# Patient Record
Sex: Female | Born: 1975 | Race: White | Hispanic: No | Marital: Married | Smoking: Never smoker
Health system: Southern US, Community
[De-identification: ages and names within clinical notes are randomized; demographics above are authoritative.]

## PROBLEM LIST (undated history)

## (undated) DIAGNOSIS — L309 Dermatitis, unspecified: Secondary | ICD-10-CM

## (undated) DIAGNOSIS — D649 Anemia, unspecified: Secondary | ICD-10-CM

---

## 2001-05-18 ENCOUNTER — Other Ambulatory Visit: Admission: RE | Admit: 2001-05-18 | Discharge: 2001-05-18 | Payer: Self-pay | Admitting: Obstetrics and Gynecology

## 2001-06-01 ENCOUNTER — Encounter: Payer: Self-pay | Admitting: Obstetrics and Gynecology

## 2001-06-01 ENCOUNTER — Ambulatory Visit (HOSPITAL_COMMUNITY): Admission: RE | Admit: 2001-06-01 | Discharge: 2001-06-01 | Payer: Self-pay | Admitting: Obstetrics and Gynecology

## 2001-06-29 ENCOUNTER — Encounter: Payer: Self-pay | Admitting: Obstetrics and Gynecology

## 2001-06-29 ENCOUNTER — Ambulatory Visit (HOSPITAL_COMMUNITY): Admission: RE | Admit: 2001-06-29 | Discharge: 2001-06-29 | Payer: Self-pay | Admitting: Obstetrics and Gynecology

## 2001-08-11 ENCOUNTER — Inpatient Hospital Stay (HOSPITAL_COMMUNITY): Admission: AD | Admit: 2001-08-11 | Discharge: 2001-08-11 | Payer: Self-pay | Admitting: *Deleted

## 2001-10-28 ENCOUNTER — Inpatient Hospital Stay (HOSPITAL_COMMUNITY): Admission: AD | Admit: 2001-10-28 | Discharge: 2001-10-30 | Payer: Self-pay | Admitting: Obstetrics and Gynecology

## 2002-08-02 ENCOUNTER — Other Ambulatory Visit: Admission: RE | Admit: 2002-08-02 | Discharge: 2002-08-02 | Payer: Self-pay | Admitting: Obstetrics and Gynecology

## 2003-03-13 ENCOUNTER — Other Ambulatory Visit: Admission: RE | Admit: 2003-03-13 | Discharge: 2003-03-13 | Payer: Self-pay | Admitting: Obstetrics and Gynecology

## 2003-07-17 ENCOUNTER — Inpatient Hospital Stay (HOSPITAL_COMMUNITY): Admission: AD | Admit: 2003-07-17 | Discharge: 2003-07-19 | Payer: Self-pay | Admitting: Obstetrics and Gynecology

## 2003-08-31 ENCOUNTER — Other Ambulatory Visit: Admission: RE | Admit: 2003-08-31 | Discharge: 2003-08-31 | Payer: Self-pay | Admitting: Obstetrics and Gynecology

## 2012-04-19 ENCOUNTER — Other Ambulatory Visit: Payer: Self-pay | Admitting: Obstetrics and Gynecology

## 2012-05-26 ENCOUNTER — Encounter (HOSPITAL_COMMUNITY): Payer: Self-pay | Admitting: Pharmacist

## 2012-06-01 NOTE — H&P (Signed)
Veronica Murphy, BUCHNER NO.:  [id number]  MEDICAL RECORD NO.:  [id number]  LOCATION:                                 FACILITY:  PHYSICIAN:  Malachi Pro. Ambrose Mantle, M.D. DATE OF BIRTH:  Jun 12, 1975  DATE OF ADMISSION:  06/02/2012 DATE OF DISCHARGE:                             HISTORY & PHYSICAL   PRESENT ILLNESS:  This is a 37 year old white female, para 2-0-0-2, who is admitted to the hospital for D and C conization because of high grade squamous intraepithelial lesion on Pap smear, February 03, 2012 showed a high-grade squamous intraepithelial lesion, moderate dysplasia was present.  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  The patient was advised conization and she is admitted now for that procedure.  PAST MEDICAL HISTORY:  Last period just started 3 days ago.  History is of eczema.  SURGICAL HISTORY:  She has had no surgical procedures.  ALLERGIES:  She is allergic to NEOMYCIN but no LATEX allergy.  SOCIAL HISTORY:  She rarely drinks alcohol.  She has never smoked tobacco.  Denies illicit substance abuse.  PHYSICAL EXAMINATION:  VITAL SIGNS:  On admission, blood pressure is 100/60, pulse is 80. HEAD, EYES, NOSE, AND THROAT:  The patient is a pale appearing, very slender white female, in no distress. NECK:  Supple without thyromegaly.  Pharynx is clear. LUNGS:  Clear to auscultation. HEART:  Normal size and sounds.  No murmurs. ABDOMEN:  Soft, nontender.  No masses are palpable. PELVIC:  Vulva and vagina have some menstrual blood present.  Cervix is clean.  The uterus is mid plane, normal size.  The adnexa are free of masses.  ADMITTING IMPRESSION:  High grade cervical dysplasia.  The patient is admitted for D and C  conization of the cervix.  Risks of surgery have been given to the patient including but not limited to hemorrhage, infection, perforation of the uterus, injury to bladder or bowel, deep venous thrombosis, blood transfusion.  She is prepared to go for surgery.     Malachi Pro. Ambrose Mantle, M.D.    TFH/MEDQ  D:  06/01/2012  T:  06/01/2012  Job:  [id number]

## 2012-06-02 ENCOUNTER — Ambulatory Visit (HOSPITAL_COMMUNITY)
Admission: RE | Admit: 2012-06-02 | Discharge: 2012-06-02 | Disposition: A | Discharge status: Home / Self Care | Payer: 59 | Source: Ambulatory Visit | Attending: Obstetrics and Gynecology | Admitting: Obstetrics and Gynecology

## 2012-06-02 ENCOUNTER — Ambulatory Visit (HOSPITAL_COMMUNITY): Payer: 59 | Admitting: Anesthesiology

## 2012-06-02 ENCOUNTER — Encounter (HOSPITAL_COMMUNITY)
Admission: RE | Disposition: A | Discharge status: Home / Self Care | Payer: Self-pay | Source: Ambulatory Visit | Attending: Obstetrics and Gynecology

## 2012-06-02 ENCOUNTER — Encounter (HOSPITAL_COMMUNITY): Payer: Self-pay | Admitting: Anesthesiology

## 2012-06-02 ENCOUNTER — Encounter (HOSPITAL_COMMUNITY): Payer: Self-pay | Admitting: *Deleted

## 2012-06-02 DIAGNOSIS — N871 Moderate cervical dysplasia: Secondary | ICD-10-CM | POA: Insufficient documentation

## 2012-06-02 DIAGNOSIS — IMO0002 Reserved for concepts with insufficient information to code with codable children: Secondary | ICD-10-CM

## 2012-06-02 HISTORY — PX: CERVICAL CONIZATION W/BX: SHX1330

## 2012-06-02 HISTORY — DX: Dermatitis, unspecified: L30.9

## 2012-06-02 HISTORY — DX: Anemia, unspecified: D64.9

## 2012-06-02 LAB — CBC WITH DIFFERENTIAL/PLATELET
Basophils Relative: 0 % (ref 0–1)
Eosinophils Absolute: 0.1 10*3/uL (ref 0.0–0.7)
MCH: 29.6 pg (ref 26.0–34.0)
MCHC: 33.4 g/dL (ref 30.0–36.0)
Neutro Abs: 2.9 10*3/uL (ref 1.7–7.7)
Neutrophils Relative %: 53 % (ref 43–77)
Platelets: 200 10*3/uL (ref 150–400)
RBC: 4.46 MIL/uL (ref 3.87–5.11)

## 2012-06-02 LAB — COMPREHENSIVE METABOLIC PANEL
ALT: 11 U/L (ref 0–35)
AST: 15 U/L (ref 0–37)
Albumin: 3.7 g/dL (ref 3.5–5.2)
Alkaline Phosphatase: 51 U/L (ref 39–117)
Potassium: 4 mEq/L (ref 3.5–5.1)
Sodium: 138 mEq/L (ref 135–145)
Total Protein: 6.5 g/dL (ref 6.0–8.3)

## 2012-06-02 LAB — URINE MICROSCOPIC-ADD ON

## 2012-06-02 LAB — URINALYSIS, ROUTINE W REFLEX MICROSCOPIC
Bilirubin Urine: NEGATIVE
Glucose, UA: NEGATIVE mg/dL
Nitrite: NEGATIVE
Specific Gravity, Urine: 1.025 (ref 1.005–1.030)
pH: 5.5 (ref 5.0–8.0)

## 2012-06-02 SURGERY — CONE BIOPSY, CERVIX
Anesthesia: General | Site: Vagina | Wound class: Clean Contaminated

## 2012-06-02 MED ORDER — IBUPROFEN 600 MG PO TABS: 600.0000 mg | ORAL_TABLET | Freq: Four times a day (QID) | ORAL | Status: AC | PRN

## 2012-06-02 MED ORDER — VASOPRESSIN 20 UNIT/ML IJ SOLN
INTRAMUSCULAR | Status: AC
  Filled 2012-06-02: qty 1

## 2012-06-02 MED ORDER — LIDOCAINE HCL (CARDIAC) 20 MG/ML IV SOLN
INTRAVENOUS | Status: DC | PRN
  Administered 2012-06-02: 20 mg via INTRAVENOUS
  Administered 2012-06-02: 30 mg via INTRAVENOUS

## 2012-06-02 MED ORDER — PROPOFOL 10 MG/ML IV EMUL
INTRAVENOUS | Status: DC | PRN
  Administered 2012-06-02: 130 mg via INTRAVENOUS

## 2012-06-02 MED ORDER — PROPOFOL 10 MG/ML IV EMUL
INTRAVENOUS | Status: AC
  Filled 2012-06-02: qty 20

## 2012-06-02 MED ORDER — SODIUM CHLORIDE 0.9 % IJ SOLN
INTRAMUSCULAR | Status: DC | PRN
  Administered 2012-06-02: 120 mL

## 2012-06-02 MED ORDER — MIDAZOLAM HCL 5 MG/5ML IJ SOLN
INTRAMUSCULAR | Status: DC | PRN
  Administered 2012-06-02: 2 mg via INTRAVENOUS

## 2012-06-02 MED ORDER — FLUMAZENIL 0.5 MG/5ML IV SOLN
INTRAVENOUS | Status: DC | PRN
  Administered 2012-06-02: 0.2 mg via INTRAVENOUS

## 2012-06-02 MED ORDER — VASOPRESSIN 20 UNIT/ML IJ SOLN
INTRAMUSCULAR | Status: DC | PRN
  Administered 2012-06-02: 10 [IU]

## 2012-06-02 MED ORDER — FENTANYL CITRATE 0.05 MG/ML IJ SOLN
INTRAMUSCULAR | Status: AC
  Filled 2012-06-02: qty 5

## 2012-06-02 MED ORDER — KETOROLAC TROMETHAMINE 30 MG/ML IJ SOLN
INTRAMUSCULAR | Status: DC | PRN
  Administered 2012-06-02: 30 mg via INTRAVENOUS

## 2012-06-02 MED ORDER — FENTANYL CITRATE 0.05 MG/ML IJ SOLN
INTRAMUSCULAR | Status: DC | PRN
  Administered 2012-06-02 (×2): 50 ug via INTRAVENOUS

## 2012-06-02 MED ORDER — KETOROLAC TROMETHAMINE 30 MG/ML IJ SOLN
INTRAMUSCULAR | Status: AC
  Filled 2012-06-02: qty 1

## 2012-06-02 MED ORDER — FENTANYL CITRATE 0.05 MG/ML IJ SOLN: 25.0000 ug | INTRAMUSCULAR | Status: DC | PRN

## 2012-06-02 MED ORDER — ACETIC ACID 4% SOLUTION
Status: DC | PRN
  Administered 2012-06-02: 1 via TOPICAL

## 2012-06-02 MED ORDER — EPHEDRINE SULFATE 50 MG/ML IJ SOLN
INTRAMUSCULAR | Status: DC | PRN
  Administered 2012-06-02: 10 mg via INTRAVENOUS

## 2012-06-02 MED ORDER — KETOROLAC TROMETHAMINE 30 MG/ML IJ SOLN: 15.0000 mg | Freq: Once | INTRAMUSCULAR | Status: DC | PRN

## 2012-06-02 MED ORDER — MIDAZOLAM HCL 2 MG/2ML IJ SOLN
INTRAMUSCULAR | Status: AC
  Filled 2012-06-02: qty 2

## 2012-06-02 MED ORDER — IODINE STRONG (LUGOLS) 5 % PO SOLN
ORAL | Status: DC | PRN
  Administered 2012-06-02: 0.2 mL via ORAL

## 2012-06-02 MED ORDER — LIDOCAINE HCL (CARDIAC) 20 MG/ML IV SOLN
INTRAVENOUS | Status: AC
  Filled 2012-06-02: qty 5

## 2012-06-02 MED ORDER — EPHEDRINE 5 MG/ML INJ
INTRAVENOUS | Status: AC
  Filled 2012-06-02: qty 10

## 2012-06-02 MED ORDER — LACTATED RINGERS IV SOLN
INTRAVENOUS | Status: DC
  Administered 2012-06-02 (×2): 125 mL/h via INTRAVENOUS

## 2012-06-02 MED ORDER — ONDANSETRON HCL 4 MG/2ML IJ SOLN
INTRAMUSCULAR | Status: AC
  Filled 2012-06-02: qty 2

## 2012-06-02 MED ORDER — FLUMAZENIL 1 MG/10ML IV SOLN
INTRAVENOUS | Status: AC
  Filled 2012-06-02: qty 10

## 2012-06-02 MED ORDER — ONDANSETRON HCL 4 MG/2ML IJ SOLN
INTRAMUSCULAR | Status: DC | PRN
  Administered 2012-06-02: 4 mg via INTRAVENOUS

## 2012-06-02 SURGICAL SUPPLY — 24 items
APPLICATOR COTTON TIP 6IN STRL (MISCELLANEOUS) ×2 IMPLANT
BLADE SURG 15 STRL LF C SS BP (BLADE) ×1 IMPLANT
BLADE SURG 15 STRL SS (BLADE) ×1
CLOTH BEACON ORANGE TIMEOUT ST (SAFETY) ×2 IMPLANT
CONTAINER PREFILL 10% NBF 60ML (FORM) ×6 IMPLANT
COUNTER NEEDLE 1200 MAGNETIC (NEEDLE) ×2 IMPLANT
DRESSING TELFA 8X3 (GAUZE/BANDAGES/DRESSINGS) ×2 IMPLANT
ELECT REM PT RETURN 9FT ADLT (ELECTROSURGICAL) ×2
ELECTRODE REM PT RTRN 9FT ADLT (ELECTROSURGICAL) ×1 IMPLANT
GAUZE SPONGE 4X4 16PLY XRAY LF (GAUZE/BANDAGES/DRESSINGS) ×2 IMPLANT
GLOVE BIO SURGEON STRL SZ7.5 (GLOVE) ×4 IMPLANT
GOWN STRL REIN XL XLG (GOWN DISPOSABLE) ×4 IMPLANT
NEEDLE SPNL 22GX3.5 QUINCKE BK (NEEDLE) ×2 IMPLANT
NS IRRIG 1000ML POUR BTL (IV SOLUTION) ×2 IMPLANT
PACK VAGINAL MINOR WOMEN LF (CUSTOM PROCEDURE TRAY) ×2 IMPLANT
PAD OB MATERNITY 4.3X12.25 (PERSONAL CARE ITEMS) ×2 IMPLANT
PENCIL BUTTON HOLSTER BLD 10FT (ELECTRODE) ×2 IMPLANT
SCOPETTES 8  STERILE (MISCELLANEOUS) ×2
SCOPETTES 8 STERILE (MISCELLANEOUS) ×2 IMPLANT
SPONGE SURGIFOAM ABS GEL 12-7 (HEMOSTASIS) IMPLANT
SUT CHROMIC 0 CT 1 (SUTURE) ×4 IMPLANT
SYR CONTROL 10ML LL (SYRINGE) ×2 IMPLANT
TOWEL OR 17X24 6PK STRL BLUE (TOWEL DISPOSABLE) ×4 IMPLANT
WATER STERILE IRR 1000ML POUR (IV SOLUTION) ×2 IMPLANT

## 2012-06-02 NOTE — Progress Notes (Signed)
Patient ID: Veronica Murphy, female   DOB: Dec 30, 1975, 37 y.o.   MRN: [id number] I examined this lady 06-01-12 and she reports no change in her health since that time.

## 2012-06-02 NOTE — Anesthesia Postprocedure Evaluation (Signed)
Anesthesia Post Note  Patient: Veronica Murphy  Procedure(s) Performed: Procedure(s) (LRB): CONIZATION CERVIX WITH BIOPSY (N/A)  Anesthesia type: General  Patient location: PACU  Post pain: Pain level controlled  Post assessment: Post-op Vital signs reviewed  Last Vitals:  Filed Vitals:   06/02/12 0830  BP: 107/69  Pulse: 70  Temp:   Resp: 12    Post vital signs: Reviewed  Level of consciousness: sedated  Complications: No apparent anesthesia complications

## 2012-06-02 NOTE — Anesthesia Preprocedure Evaluation (Signed)
Anesthesia Evaluation  Patient identified by MRN, date of birth, ID band Patient awake    Reviewed: Allergy & Precautions, H&P , NPO status , Patient's Chart, lab work & pertinent test results, reviewed documented beta blocker date and time   History of Anesthesia Complications Negative for: history of anesthetic complications  Airway Mallampati: II TM Distance: >3 FB Neck ROM: full    Dental  (+) Teeth Intact   Pulmonary neg pulmonary ROS,  breath sounds clear to auscultation  Pulmonary exam normal       Cardiovascular Exercise Tolerance: Good negative cardio ROS  Rhythm:regular Rate:Normal     Neuro/Psych negative neurological ROS  negative psych ROS   GI/Hepatic negative GI ROS, Neg liver ROS,   Endo/Other  negative endocrine ROS  Renal/GU negative Renal ROS  Female GU complaint     Musculoskeletal   Abdominal   Peds  Hematology negative hematology ROS (+)   Anesthesia Other Findings   Reproductive/Obstetrics negative OB ROS                           Anesthesia Physical Anesthesia Plan  ASA: I  Anesthesia Plan: General LMA   Post-op Pain Management:    Induction:   Airway Management Planned:   Additional Equipment:   Intra-op Plan:   Post-operative Plan:   Informed Consent: I have reviewed the patients History and Physical, chart, labs and discussed the procedure including the risks, benefits and alternatives for the proposed anesthesia with the patient or authorized representative who has indicated his/her understanding and acceptance.   Dental Advisory Given  Plan Discussed with: CRNA and Surgeon  Anesthesia Plan Comments:         Anesthesia Quick Evaluation  

## 2012-06-02 NOTE — Op Note (Signed)
Operative note on Veronica Murphy:  Date of the operation: 06/02/2012  Preoperative diagnosis high-grade squamous intraepithelial lesion  Postop diagnosis: Same  Operation: D&C conization of the cervix  Operator: Charle Mclaurin  Anesthesia Gen.  The patient was brought to the operating room and placed under satisfactory general anesthesia and placed in lithotomy position in the [place] stirrups. A time out was done. The vulva was prepped with Betadine solution and the urethra was prepped and the bladder was emptied with a [place] catheter. Exam revealed the uterus to be anterior upper limit of normal size, the adnexa were free of masses. A speculum was placed into the vagina and the cervix was prepped with 5% acetic acid. The abnormality was mainly on the posterior lip of the cervix extending into the endocervical canal at 5:00. The anterior lips seem to be spared. The cervix was injected with 8 cc of a solution prepared by mixing 10 units of Pitressin and 120 cc of saline. A conization of the cervix was then done trying to get beyond the abnormality on the posteriorly lip. The cervix was then prepped with Betadine and an endocervical curettage was done producing minimal tissue. The cervical canal was dilated so that it would admit a small curet and an endometrial curettage was done. All tissue was preserved. Hemostasis was achieved on the cervix with the Bovie. 4 sutures of 0 chromic were used to reapproximate the cervix by placing the sutures from the 11-1,5to7,2to4 and 8 to 10:00.the uterine canal was sounded confirming patency of the canal and the procedure was terminated. Blood loss about 10 cc. Sponge and needle counts were correct. The patient was returned to recovery in satisfactory condition

## 2012-06-02 NOTE — Transfer of Care (Signed)
Immediate Anesthesia Transfer of Care Note  Patient: Veronica Murphy  Procedure(s) Performed: Procedure(s) (LRB) with comments: CONIZATION CERVIX WITH BIOPSY (N/A)  Patient Location: PACU  Anesthesia Type:General  Level of Consciousness: sedated and patient cooperative  Airway & Oxygen Therapy: Patient Spontanous Breathing and Patient connected to nasal cannula oxygen  Post-op Assessment: Report given to PACU RN and Post -op Vital signs reviewed and stable  Post vital signs: Reviewed and stable  Complications: No apparent anesthesia complications

## 2012-06-03 ENCOUNTER — Encounter (HOSPITAL_COMMUNITY): Payer: Self-pay | Admitting: Obstetrics and Gynecology

## 2014-07-24 ENCOUNTER — Other Ambulatory Visit: Payer: Self-pay | Admitting: Obstetrics and Gynecology

## 2014-07-24 DIAGNOSIS — N644 Mastodynia: Secondary | ICD-10-CM

## 2014-07-27 ENCOUNTER — Ambulatory Visit
Admission: RE | Admit: 2014-07-27 | Discharge: 2014-07-27 | Disposition: A | Discharge status: Home / Self Care | Payer: 59 | Source: Ambulatory Visit | Attending: Obstetrics and Gynecology | Admitting: Obstetrics and Gynecology

## 2014-07-27 ENCOUNTER — Other Ambulatory Visit: Payer: Self-pay | Admitting: Obstetrics and Gynecology

## 2014-07-27 DIAGNOSIS — N644 Mastodynia: Secondary | ICD-10-CM

## 2015-11-04 IMAGING — MG MM DIAG BREAST TOMO BILATERAL
8 series · 9 of 24 positions shown · non-contrast
Comparison: None

CLINICAL DATA: Patient with diffuse bilateral breast pain.
Additionally the patient reports focal tenderness within the 6
o'clock position right breast.

EXAM:
DIGITAL DIAGNOSTIC RIGHT MAMMOGRAM WITH 3D TOMOSYNTHESIS WITH CAD
ULTRASOUND RIGHT BREAST

[R MLO]
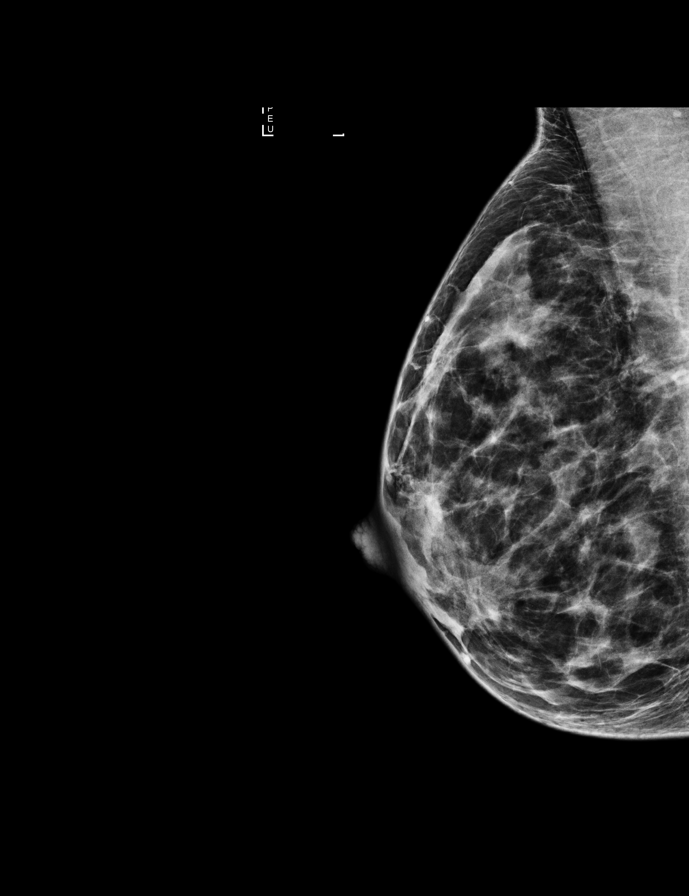

[L MLO]
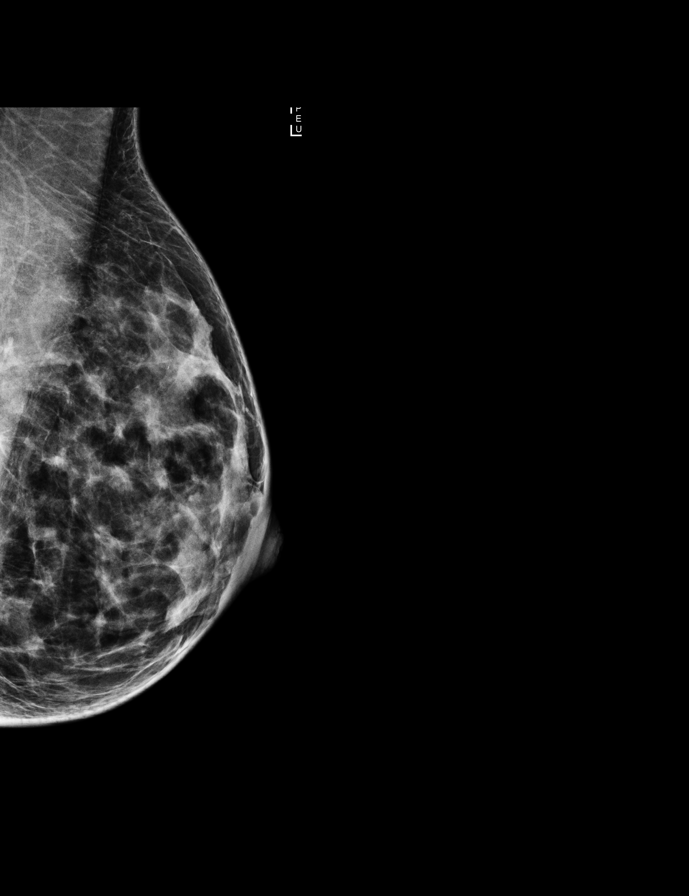

[R CC]
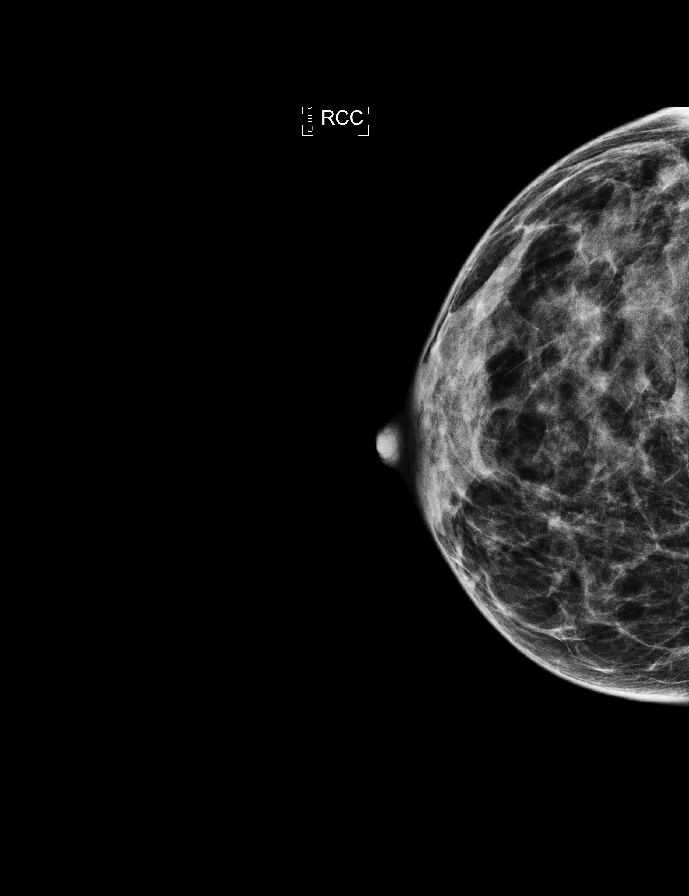

[L CC]
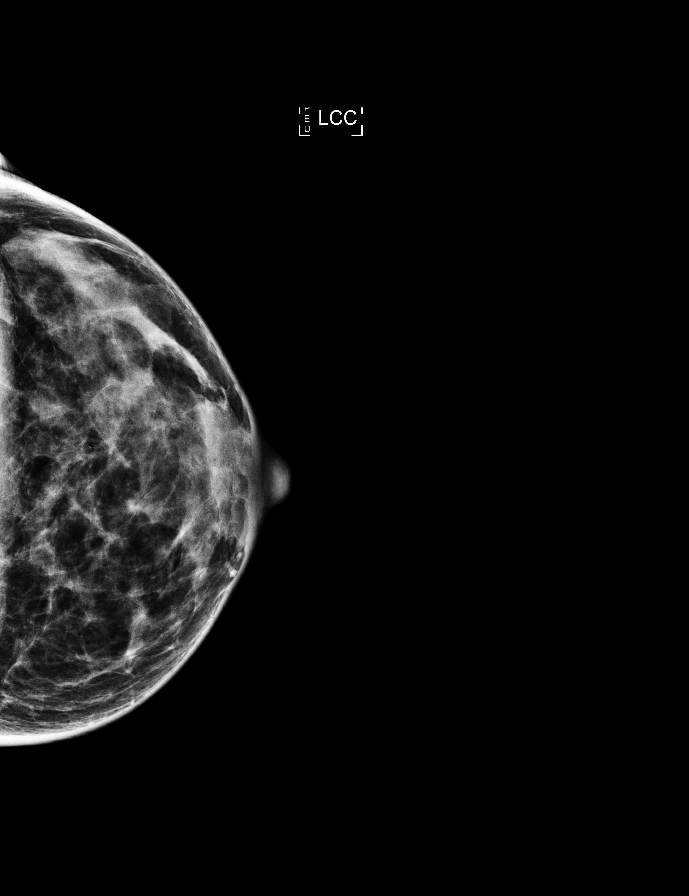

[R MLO tomo · 2 of 44 frames shown]
[frame 15/44]
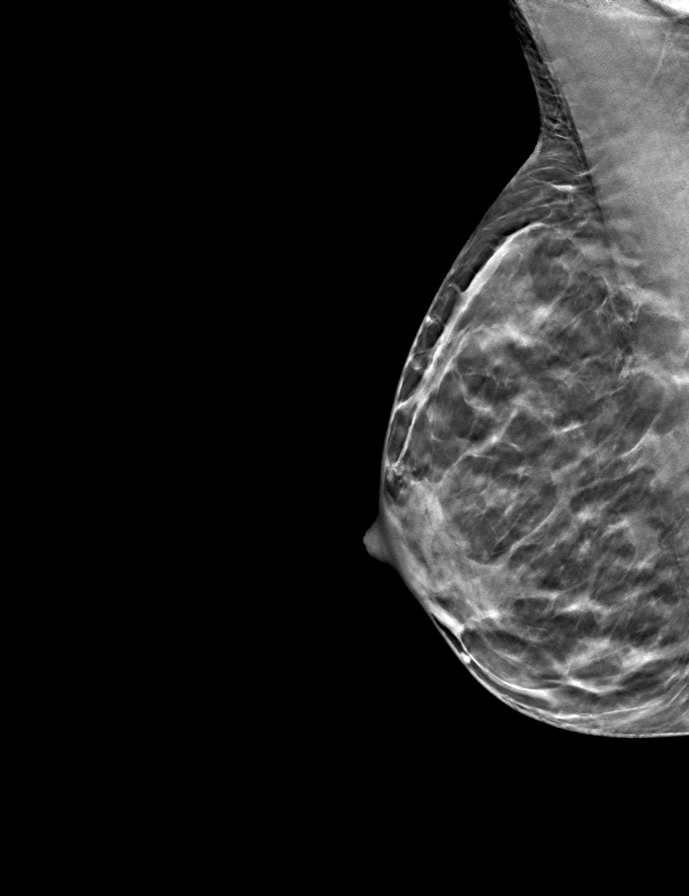
[frame 23/44]
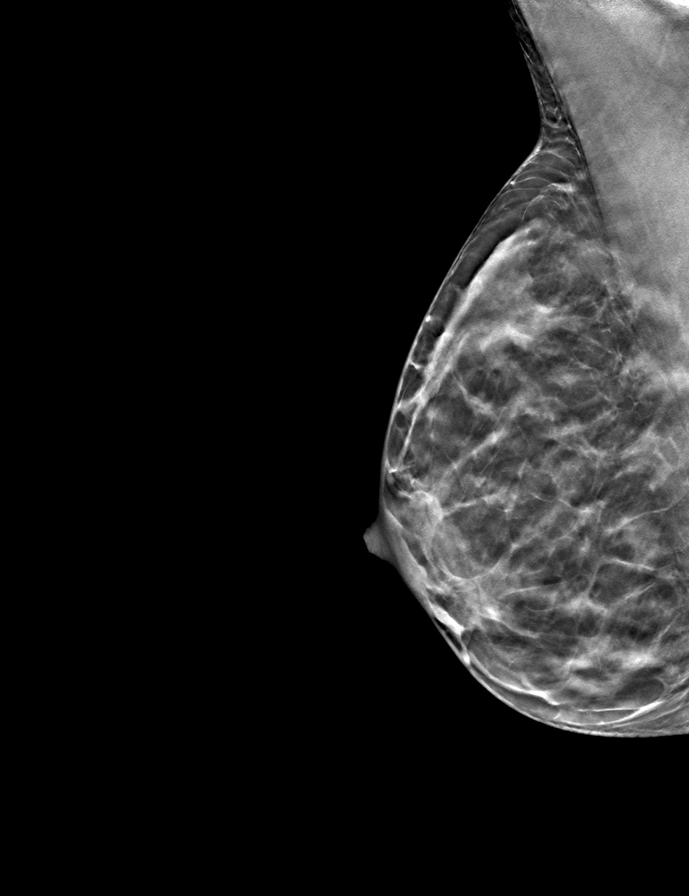

[R CC tomo · tomo slice 22/43.0]
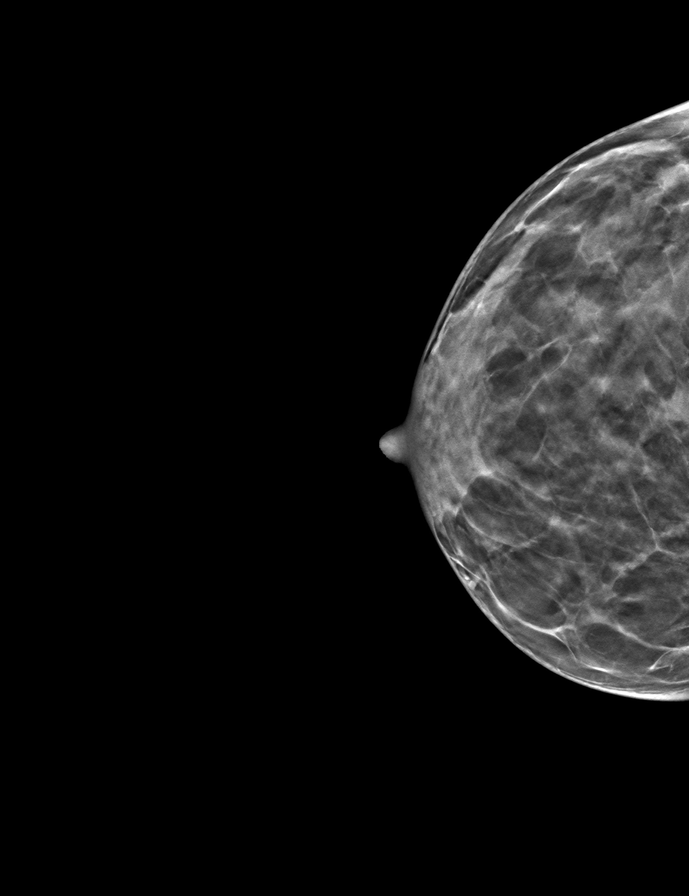

[L MLO tomo · tomo slice 23/44.0]
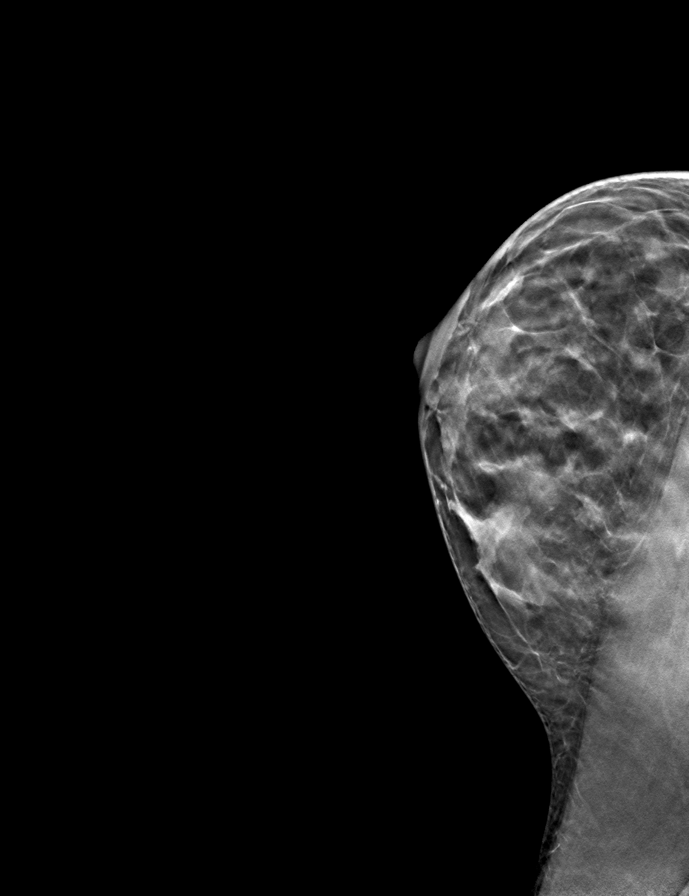

[L CC tomo · tomo slice 22/43.0]
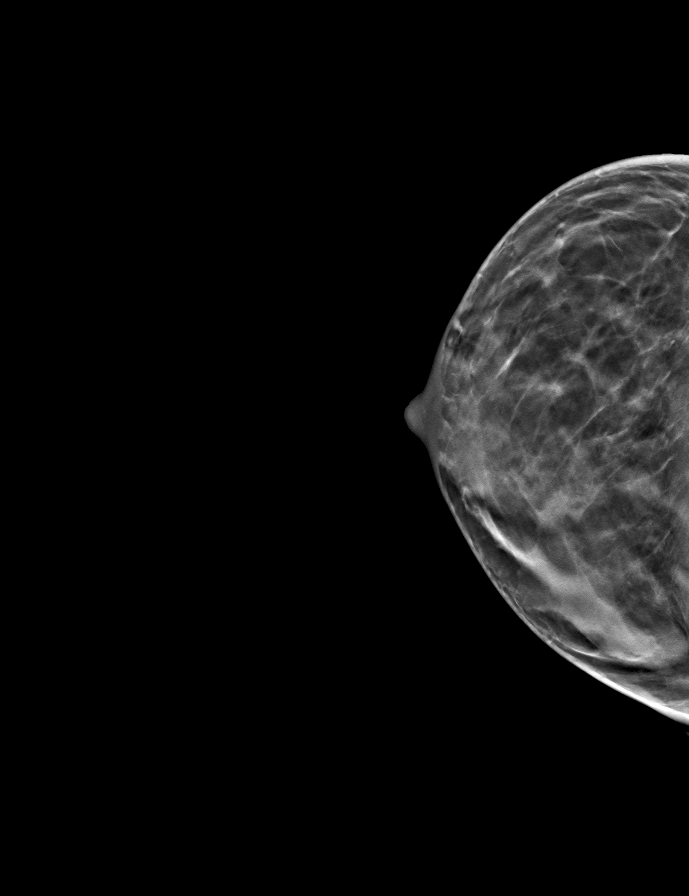

[9 of 24 positions shown; findings below may reference images not displayed]

ACR Breast Density Category c: The breast tissue is heterogeneously
dense, which may obscure small masses.
FINDINGS: No concerning masses, calcifications or architectural distortion
identified within either breast.

Mammographic images were processed with CAD.

On physical exam, I palpate no discrete mass within the 6 o'clock
position right breast.

Targeted ultrasound is performed, showing no suspicious abnormality
within the 6 o'clock position right breast at the reported site of
focal tenderness.
IMPRESSION: No mammographic evidence for malignancy.

RECOMMENDATION:
Screening mammogram at age 41 unless there are persistent or
intervening clinical concerns. (Code:CN-5-BDM)

I have discussed the findings and recommendations with the patient.
Results were also provided in writing at the conclusion of the
visit. If applicable, a reminder letter will be sent to the patient
regarding the next appointment.

BI-RADS CATEGORY  1: Negative.

## 2021-01-05 ENCOUNTER — Other Ambulatory Visit: Payer: Self-pay

## 2021-01-05 ENCOUNTER — Emergency Department (HOSPITAL_BASED_OUTPATIENT_CLINIC_OR_DEPARTMENT_OTHER): Payer: 59

## 2021-01-05 ENCOUNTER — Emergency Department (HOSPITAL_BASED_OUTPATIENT_CLINIC_OR_DEPARTMENT_OTHER)
Admission: EM | Admit: 2021-01-05 | Discharge: 2021-01-05 | Disposition: A | Discharge status: Home / Self Care | Payer: 59 | Attending: Emergency Medicine | Admitting: Emergency Medicine

## 2021-01-05 ENCOUNTER — Encounter (HOSPITAL_BASED_OUTPATIENT_CLINIC_OR_DEPARTMENT_OTHER): Payer: Self-pay

## 2021-01-05 DIAGNOSIS — M25571 Pain in right ankle and joints of right foot: Secondary | ICD-10-CM | POA: Insufficient documentation

## 2021-01-05 DIAGNOSIS — Y9368 Activity, volleyball (beach) (court): Secondary | ICD-10-CM | POA: Insufficient documentation

## 2021-01-05 DIAGNOSIS — X501XXA Overexertion from prolonged static or awkward postures, initial encounter: Secondary | ICD-10-CM | POA: Diagnosis not present

## 2021-01-05 MED ORDER — IBUPROFEN 800 MG PO TABS
800.0000 mg | ORAL_TABLET | Freq: Once | ORAL | Status: AC
  Administered 2021-01-05: 800 mg via ORAL
  Filled 2021-01-05: qty 1

## 2021-01-05 NOTE — ED Triage Notes (Signed)
Pt arrives with c/o pain to right ankle states that she rolled it while playing volleyball today and heard "a crunching sound". No medication PTA.

## 2021-01-05 NOTE — ED Notes (Signed)
Pt refused our crutches, she states she has a pair at home she plans to use.

## 2021-01-05 NOTE — ED Provider Notes (Signed)
[facility] EMERGENCY DEPARTMENT Provider Note   CSN: [id number] Arrival date & time: 01/05/21  1546     History Chief Complaint  Patient presents with   Ankle Pain    Veronica Murphy is a 45 y/o F presenting to the ED for right ankle pain and swelling after landing on it at volleyball 1 hour pta. The patient states that she went to step toward the ball and stepped on her husband's shoe causing her ankle to invert and landing her body weight on the lateral side of her ankle. She was able to bear weight with pain. Denies any numbness or tingling. She denies any medical or surgical history. Denies any daily medications. Allergic to Neomycin. LMP 7 days ago.    Ankle Pain     Past Medical History:  Diagnosis Date   Anemia    hx   Eczema    SVD (spontaneous vaginal delivery)    x 3    There are no problems to display for this patient.   Past Surgical History:  Procedure Laterality Date   CERVICAL CONIZATION W/BX  06/02/2012   Procedure: CONIZATION CERVIX WITH BIOPSY;  Surgeon: Bing Plume, MD;  Location: [facility];  Service: Gynecology;  Laterality: N/A;     OB History   No obstetric history on file.     No family history on file.  Social History   Tobacco Use   Smoking status: Never   Smokeless tobacco: Never  Vaping Use   Vaping Use: Never used  Substance Use Topics   Alcohol use: Yes    Comment: socially   Drug use: No    Home Medications Prior to Admission medications   Medication Sig Start Date End Date Taking? Authorizing Provider  Desoximetasone (TOPICORT) 0.25 % ointment Apply 1 application topically 2 (two) times a week.    [provider]  ibuprofen (ADVIL,MOTRIN) 600 MG tablet Take 1 tablet (600 mg total) by mouth every 6 (six) hours as needed for pain. 06/02/12   Tracey Harries, MD    Allergies    Neomycin  Review of Systems   Review of Systems  Musculoskeletal:  Positive for arthralgias and joint swelling.  Skin:   Negative for color change and rash.  Neurological:  Negative for syncope and weakness.  All other systems reviewed and are negative.  Physical Exam Updated Vital Signs BP (!) 130/98 (BP Location: Right Arm)   Pulse 82   Temp 98.3 F (36.8 C) (Oral)   Resp 18   Ht 5\' 4"  (1.626 m)   Wt 56.7 kg   LMP 12/27/2020   SpO2 100%   BMI 21.46 kg/m   Physical Exam Vitals and nursing note reviewed.  Constitutional:      Appearance: Normal appearance.  Eyes:     General: No scleral icterus. Pulmonary:     Effort: Pulmonary effort is normal. No respiratory distress.  Musculoskeletal:        General: Swelling, tenderness and signs of injury present. No deformity.     Comments: R ankle - No overlying skin color changes or obvious deformities. Moderate lateral swelling over the malleolus. Limited ROM secondary to pain. Neurovascularly intact.   Skin:    General: Skin is dry.     Findings: No bruising or rash.  Neurological:     General: No focal deficit present.     Mental Status: She is alert. Mental status is at baseline.  Psychiatric:  Mood and Affect: Mood normal.    ED Results / Procedures / Treatments   Labs (all labs ordered are listed, but only abnormal results are displayed) Labs Reviewed - No data to display  EKG None  Radiology DG Ankle Complete Right  Result Date: 01/05/2021 CLINICAL DATA:  Ankle injury today. Patient heard a crunching sound and complains of pain. EXAM: RIGHT ANKLE - COMPLETE 3+ VIEW COMPARISON:  None. FINDINGS: The mineralization and alignment are normal. There is no evidence of acute fracture or dislocation. The joint spaces are preserved. There is moderate lateral soft tissue swelling without evidence of foreign body or soft tissue emphysema. IMPRESSION: No evidence of acute osseous injury. Moderate lateral soft tissue swelling. Electronically Signed   By: Carey Bullocks M.D.   On: 01/05/2021 16:42    Procedures Procedures   Medications  Ordered in ED Medications  ibuprofen (ADVIL) tablet 800 mg (800 mg Oral Given 01/05/21 1629)    ED Course  I have reviewed the triage vital signs and the nursing notes.  Pertinent labs & imaging results that were available during my care of the patient were reviewed by me and considered in my medical decision making (see chart for details).  Veronica Murphy is a 45 y/o F presenting for right ankle pain and swelling after inverting it during a volleyball game 1hr pta. Differential includes fracture, ankle sprain, ankle strain.  Patient declined stronger pain medication and requested ibuprofen.   I personally review the patients ankle xray and no bony abnormalities or fractures noticed, just lateral soft tissue swelling.   Re-evaluated patient and she reports with elevation and medication, her pain is decreasing.   Discussed the RICE method with the patient and husband along with using Ibuprofen to help with swelling and with pain. Will refer to non-surgical orthopedics. Patient and husband are agreeable with plan and have no further questions. Return precautions given. Patient is stable and is being discharged in good condition.    MDM Rules/Calculators/A&P                          Final Clinical Impression(s) / ED Diagnoses Final diagnoses:  Acute right ankle pain    Rx / DC Orders ED Discharge Orders     None        Achille Rich, PA-C 01/05/21 1712    Pollyann Savoy, MD 01/05/21 [id number]

## 2021-01-05 NOTE — ED Notes (Signed)
Patient transported to X-ray 

## 2021-01-05 NOTE — Discharge Instructions (Addendum)
Review the RICE method handout for treatment or ankle swelling.  Follow up with Non-surgical Ortho attached.  Ibuprofen 800mg  every 8 hours can help with pain.  Please return for any new or worsening symptoms.

## 2021-12-30 ENCOUNTER — Other Ambulatory Visit: Payer: Self-pay | Admitting: Family Medicine

## 2021-12-30 DIAGNOSIS — K529 Noninfective gastroenteritis and colitis, unspecified: Secondary | ICD-10-CM

## 2022-01-16 ENCOUNTER — Ambulatory Visit
Admission: RE | Admit: 2022-01-16 | Discharge: 2022-01-16 | Disposition: A | Discharge status: Home / Self Care | Payer: 59 | Source: Ambulatory Visit | Attending: Family Medicine | Admitting: Family Medicine

## 2022-01-16 DIAGNOSIS — K529 Noninfective gastroenteritis and colitis, unspecified: Secondary | ICD-10-CM

## 2022-05-28 IMAGING — DX DG ANKLE COMPLETE 3+V*R*
3 series · 3 of 3 positions shown · non-contrast
Comparison: None.

CLINICAL DATA: Ankle injury today. Patient heard a crunching sound
and complains of pain.

EXAM:
RIGHT ANKLE - COMPLETE 3+ VIEW

[ankle ap]
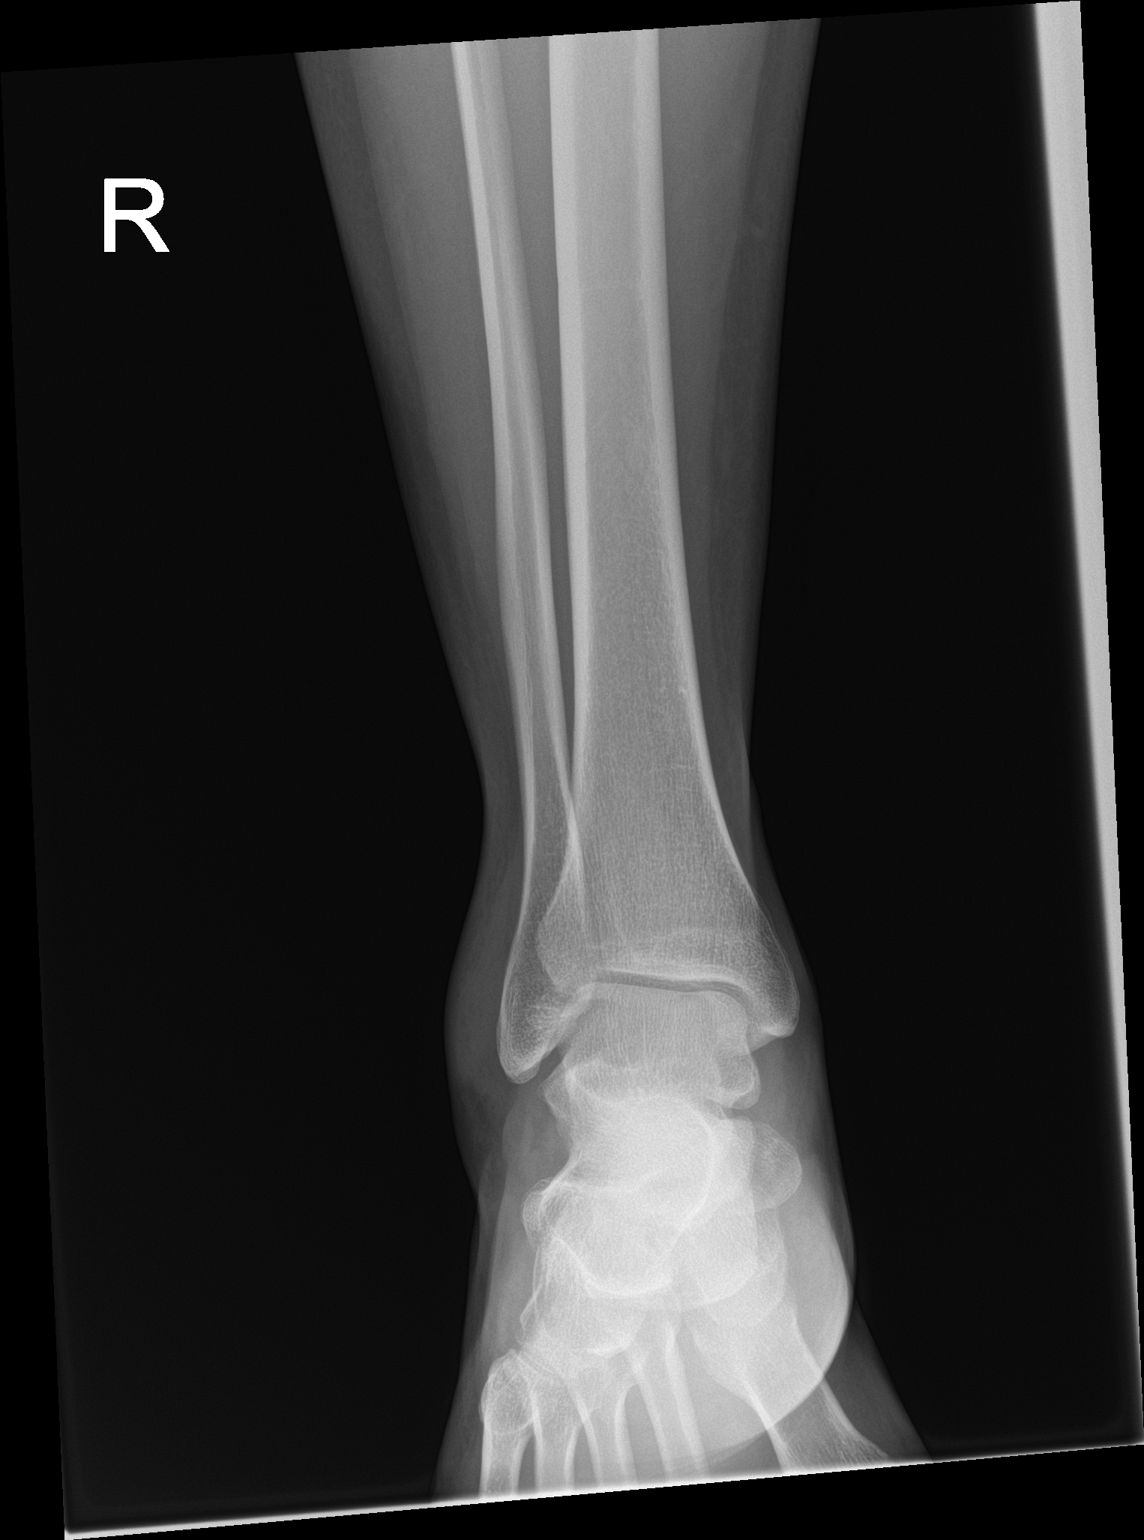

[ankle obl]
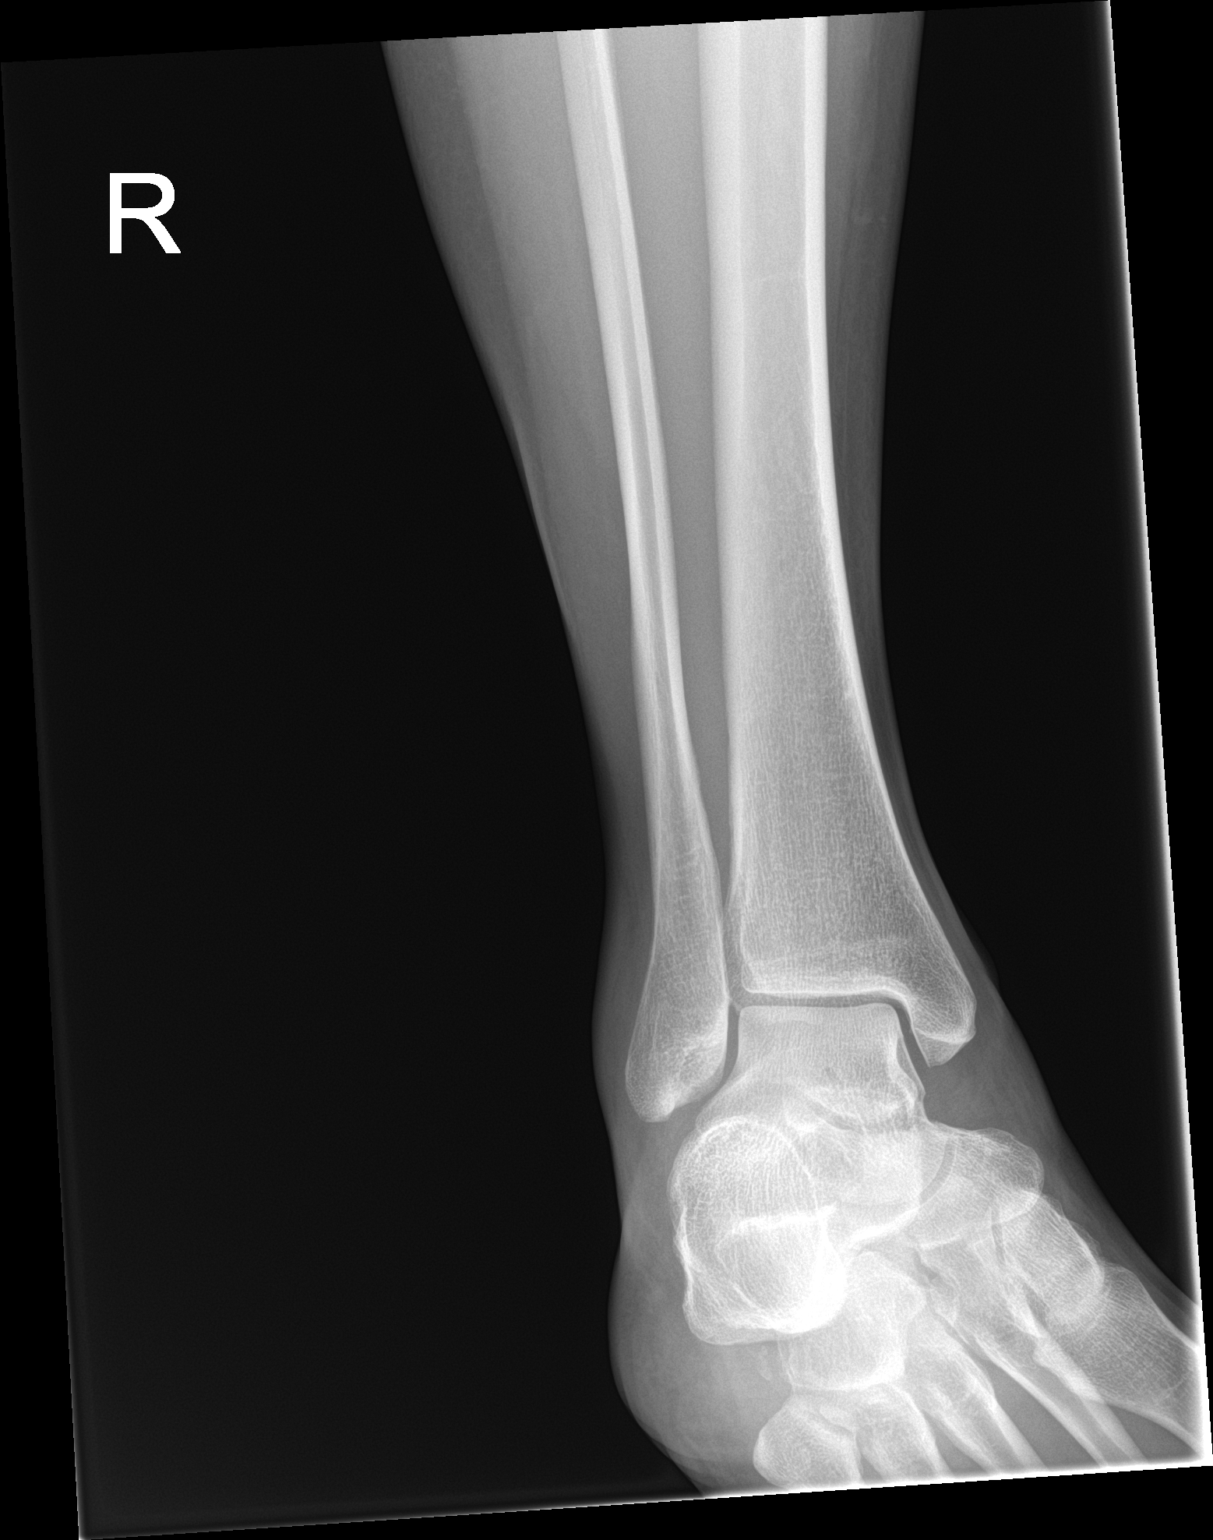

[ankle lat]
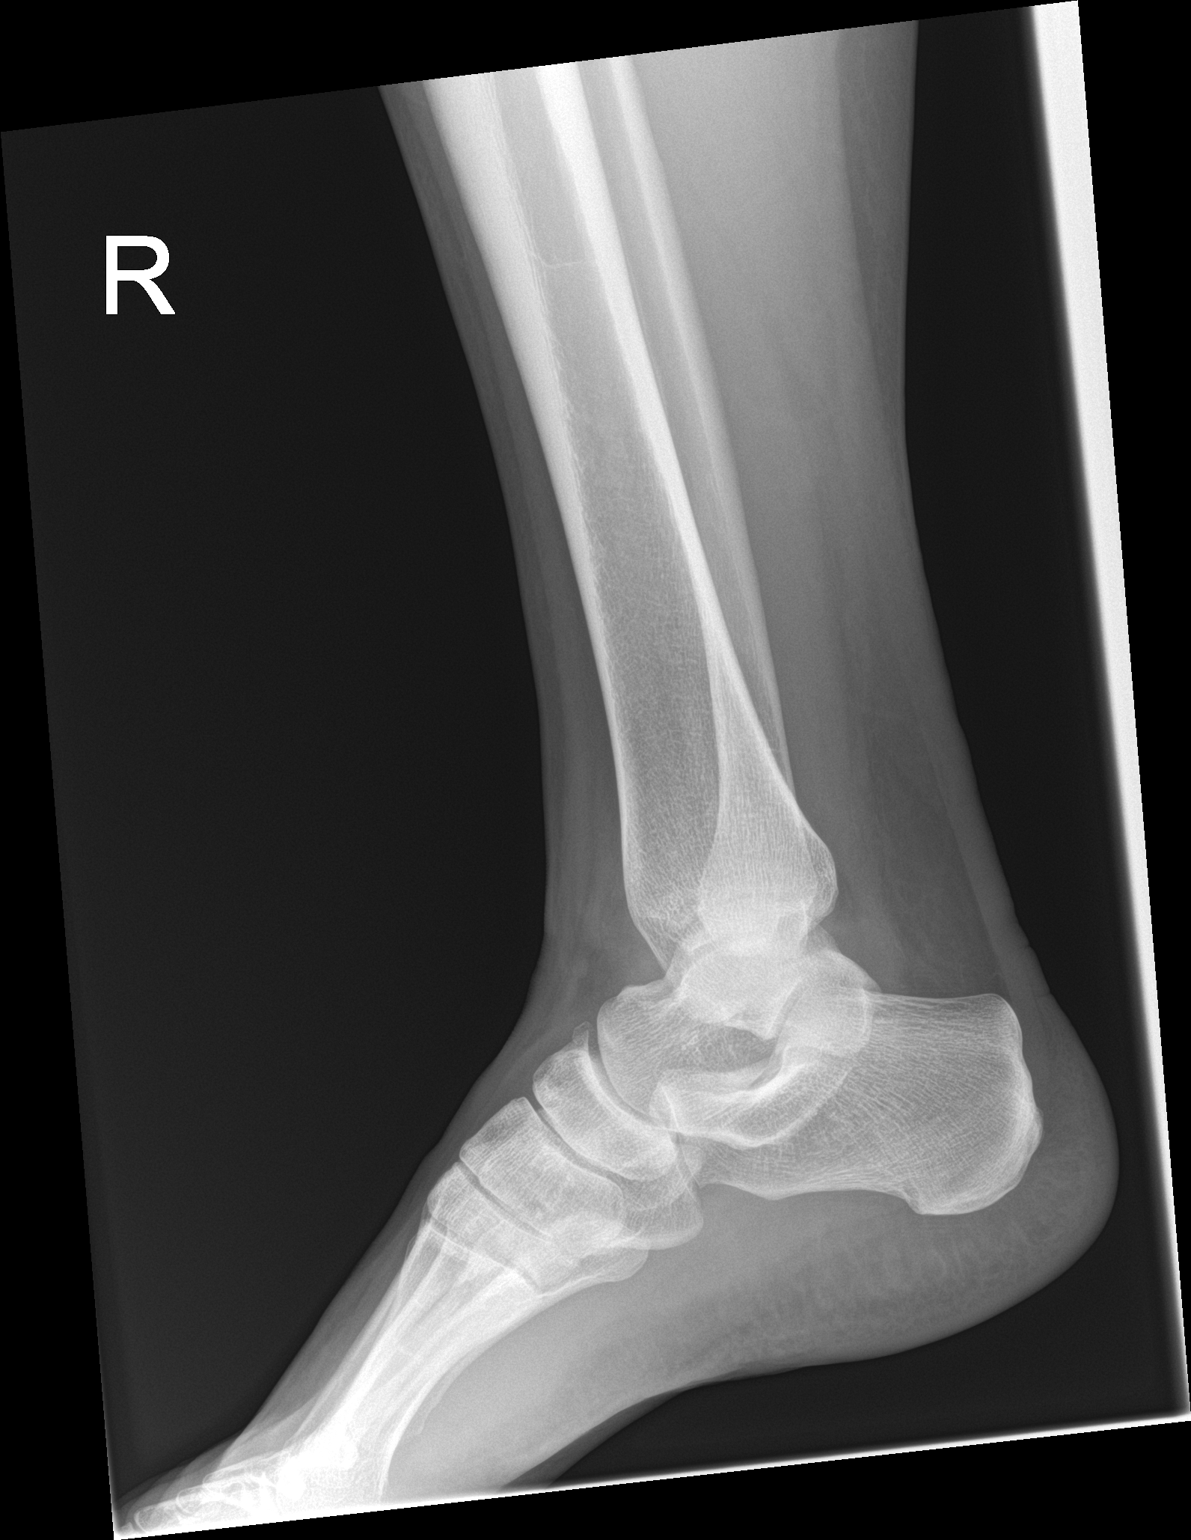

[3 of 3 positions shown; findings below may reference images not displayed]

FINDINGS: The mineralization and alignment are normal. There is no evidence of
acute fracture or dislocation. The joint spaces are preserved. There
is moderate lateral soft tissue swelling without evidence of foreign
body or soft tissue emphysema.
IMPRESSION: No evidence of acute osseous injury. Moderate lateral soft tissue
swelling.

## 2023-02-12 ENCOUNTER — Other Ambulatory Visit: Payer: Self-pay | Admitting: Obstetrics & Gynecology

## 2023-02-12 DIAGNOSIS — E039 Hypothyroidism, unspecified: Secondary | ICD-10-CM

## 2023-02-18 ENCOUNTER — Ambulatory Visit
Admission: RE | Admit: 2023-02-18 | Discharge: 2023-02-18 | Disposition: A | Discharge status: Home / Self Care | Payer: 59 | Source: Ambulatory Visit | Attending: Obstetrics & Gynecology | Admitting: Obstetrics & Gynecology

## 2023-02-18 DIAGNOSIS — E039 Hypothyroidism, unspecified: Secondary | ICD-10-CM

## 2023-03-19 ENCOUNTER — Other Ambulatory Visit: Payer: Self-pay | Admitting: Obstetrics & Gynecology

## 2023-03-19 DIAGNOSIS — E041 Nontoxic single thyroid nodule: Secondary | ICD-10-CM

## 2023-04-09 ENCOUNTER — Ambulatory Visit
Admission: RE | Admit: 2023-04-09 | Discharge: 2023-04-09 | Disposition: A | Discharge status: Home / Self Care | Payer: 59 | Source: Ambulatory Visit | Attending: Obstetrics & Gynecology | Admitting: Obstetrics & Gynecology

## 2023-04-09 ENCOUNTER — Other Ambulatory Visit (HOSPITAL_COMMUNITY)
Admission: RE | Admit: 2023-04-09 | Discharge: 2023-04-09 | Disposition: A | Discharge status: Home / Self Care | Payer: 59 | Source: Ambulatory Visit | Attending: Obstetrics & Gynecology | Admitting: Obstetrics & Gynecology

## 2023-04-09 DIAGNOSIS — E041 Nontoxic single thyroid nodule: Secondary | ICD-10-CM | POA: Diagnosis present

## 2023-04-13 LAB — CYTOLOGY - NON PAP

## 2023-04-22 ENCOUNTER — Other Ambulatory Visit: Payer: Self-pay | Admitting: Interventional Radiology

## 2023-04-22 ENCOUNTER — Ambulatory Visit
Admission: RE | Admit: 2023-04-22 | Discharge: 2023-04-22 | Disposition: A | Discharge status: Home / Self Care | Payer: 59 | Source: Ambulatory Visit | Attending: Interventional Radiology | Admitting: Interventional Radiology

## 2023-04-22 ENCOUNTER — Ambulatory Visit
Admission: RE | Admit: 2023-04-22 | Discharge: 2023-04-22 | Disposition: A | Discharge status: Home / Self Care | Payer: 59 | Source: Ambulatory Visit

## 2023-04-22 DIAGNOSIS — R221 Localized swelling, mass and lump, neck: Secondary | ICD-10-CM

## 2023-04-22 HISTORY — PX: IR RADIOLOGIST EVAL & MGMT: IMG5224

## 2023-04-22 NOTE — Progress Notes (Signed)
Chief Complaint: Neck pain  Referring Physician(s): McCullough,Heath K  History of Present Illness: Veronica Murphy is a 47 y.o. female presenting to [facility] for unscheduled appointment to evaluate post-biopsy neck pain.   [name] underwent an US guided right thyroid nodule FNA biopsy 04/09/23.   She tells me that the biopsy went fine, but afterwards she noticed slight increased swelling, with some mild-moderate pain at the right neck corresponding to the site of the nodule biopsy. She has been treating with ice.  She denies any specific symptoms of fever, night sweats, irritability, racing heart, flushing -- all of which could be seen with thyroiditis/thyroid "storm".   Today's POCUS of the right thyroid.    Previous US 02/18/23   On POCUS [place] today, the change seems to be that there is significantly increased size of the cystic component of the right thyroid cyst that had undergone FNA.  There is some misty fluid/complex fluid, within, presumably blood products.  The surrounding soft tissues are normal.     Past Medical History:  Diagnosis Date   Anemia    hx   Eczema    SVD (spontaneous vaginal delivery)    x 3    Past Surgical History:  Procedure Laterality Date   CERVICAL CONIZATION W/BX  06/02/2012   Procedure: CONIZATION CERVIX WITH BIOPSY;  Surgeon: Bing Plume, MD;  Location: [facility];  Service: Gynecology;  Laterality: N/A;    Allergies: Neomycin  Medications: Prior to Admission medications   Medication Sig Start Date End Date Taking? Authorizing Provider  Desoximetasone (TOPICORT) 0.25 % ointment Apply 1 application topically 2 (two) times a week.    [provider]  ibuprofen (ADVIL,MOTRIN) 600 MG tablet Take 1 tablet (600 mg total) by mouth every 6 (six) hours as needed for pain. 06/02/12   Tracey Harries, MD     No family history on file.  Social History   Socioeconomic History   Marital status: Married    Spouse name: Not on file    Number of children: Not on file   Years of education: Not on file   Highest education level: Not on file  Occupational History   Not on file  Tobacco Use   Smoking status: Never   Smokeless tobacco: Never  Vaping Use   Vaping status: Never Used  Substance and Sexual Activity   Alcohol use: Yes    Comment: socially   Drug use: No   Sexual activity: Yes    Birth control/protection: None  Other Topics Concern   Not on file  Social History Narrative   Not on file   Social Drivers of Health   Financial Resource Strain: Not on file  Food Insecurity: Not on file  Transportation Needs: Not on file  Physical Activity: Not on file  Stress: Not on file  Social Connections: Not on file       Review of Systems: A 12 point ROS discussed and pertinent positives are indicated in the HPI above.  All other systems are negative.  Review of Systems  Vital Signs: There were no vitals taken for this visit.    Physical Exam Tenderness to palpation at the right neck.  Slight asymmetry of the thyroid, larger on the right.  No bruising/ecchymosis.  No redness/induration.   Mallampati Score:     Imaging: [place] FNA BX THYROID 1ST LESION AFIRMA Result Date: 04/09/2023 INDICATION: Indeterminate thyroid nodule EXAM: ULTRASOUND GUIDED FINE NEEDLE ASPIRATION OF INDETERMINATE THYROID NODULE COMPARISON:  Prior  thyroid ultrasound 02/18/2023 MEDICATIONS: None COMPLICATIONS: None immediate. TECHNIQUE: Informed written consent was obtained from the patient after a discussion of the risks, benefits and alternatives to treatment. Questions regarding the procedure were encouraged and answered. A timeout was performed prior to the initiation of the procedure. Pre-procedural ultrasound scanning demonstrated unchanged size and appearance of the indeterminate nodule within the right mid gland The procedure was planned. The neck was prepped in the usual sterile fashion, and a sterile drape was applied covering the  operative field. A timeout was performed prior to the initiation of the procedure. Local anesthesia was provided with 1% lidocaine. Under direct ultrasound guidance, 5 FNA biopsies were performed of the nodule with a 25 gauge needle. Multiple ultrasound images were saved for procedural documentation purposes. The samples were prepared and submitted to pathology. Limited post procedural scanning was negative for hematoma or additional complication. Dressings were placed. The patient tolerated the above procedures procedure well without immediate postprocedural complication. FINDINGS: Nodule reference number based on prior diagnostic ultrasound: 1 Maximum size: 4.9 cm Location: Right; Mid ACR TI-RADS risk category: TR3 (3 points) Reason for biopsy: meets ACR TI-RADS criteria Ultrasound imaging confirms appropriate placement of the needles within the thyroid nodule. IMPRESSION: Technically successful ultrasound guided fine needle aspiration of thyroid nodule in the right mid gland. Electronically Signed   By: Malachy Moan M.D.   On: 04/09/2023 16:05    Labs:  CBC: No results for input(s): "WBC", "HGB", "HCT", "PLT" in the last 8760 hours.  COAGS: No results for input(s): "INR", "APTT" in the last 8760 hours.  BMP: No results for input(s): "NA", "K", "CL", "CO2", "GLUCOSE", "BUN", "CALCIUM", "CREATININE", "GFRNONAA", "GFRAA" in the last 8760 hours.  Invalid input(s): "CMP"  LIVER FUNCTION TESTS: No results for input(s): "BILITOT", "AST", "ALT", "ALKPHOS", "PROT", "ALBUMIN" in the last 8760 hours.  TUMOR MARKERS: No results for input(s): "AFPTM", "CEA", "CA199", "CHROMGRNA" in the last 8760 hours.  Assessment and Plan:  [name] is a very pleasant 47 year old female SP US guided FNA of right thyroid nodule/cyst on 12/5, with post symptoms of swelling and pain.   POCUS today shows that the cystic portion has grown larger than pre-biopsy, and has some complex fluid within.  This is  compatible with intra-cystic hemorrhage.   The surrounding tissues are absent of any sign on [place] or physical exam of hemorrhage, and she has no symptoms of thyroiditis or thyroid storm.   I discussed with her the diagnosis of intra-cystic hemorrhage and that the treatment is typically conservative management with treating the symptoms.  Measures include ice pack as needed (20 min application, 20 min relief), and over the counter analgesics that are effective.  Beyond these, we could consider another aspiration of the intra-cystic blood product, however, it is possible that it would simply reaccumulate/rehemorrhage. At this time she is not interested in this.   For now she is satisfied to use conservative measures that we discussed.  If she has any concerns/questions, or ongoing symptoms in the future, she will call us back.  We would be happy to see her again if needed.   Electronically Signed: Gilmer Mor 04/22/2023, 2:29 PM   I spent a total of  30 Minutes   in face to face in clinical consultation, greater than 50% of which was counseling/coordinating care for post-biopsy neck swelling, intra-cystic hemorrhage

## 2023-04-23 ENCOUNTER — Other Ambulatory Visit: Payer: 59
